# Patient Record
Sex: Female | Born: 2005 | Race: White | Hispanic: No | Marital: Single | Smoking: Never smoker
Health system: Southern US, Community
[De-identification: ages and names within clinical notes are randomized; demographics above are authoritative.]

## PROBLEM LIST (undated history)

## (undated) DIAGNOSIS — M419 Scoliosis, unspecified: Secondary | ICD-10-CM

## (undated) HISTORY — PX: TONSILLECTOMY: SUR1361

## (undated) HISTORY — DX: Scoliosis, unspecified: M41.9

## (undated) HISTORY — PX: ADENOIDECTOMY: SUR15

---
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
## 2023-10-26 ENCOUNTER — Encounter: Payer: Self-pay | Admitting: Sports Medicine
